# Patient Record
Sex: Male | Born: 1965 | Race: White | Hispanic: No | Marital: Married | Smoking: Never smoker
Health system: Southern US, Community
[De-identification: ages and names within clinical notes are randomized; demographics above are authoritative.]

## PROBLEM LIST (undated history)

## (undated) DIAGNOSIS — E039 Hypothyroidism, unspecified: Secondary | ICD-10-CM

## (undated) DIAGNOSIS — I10 Essential (primary) hypertension: Secondary | ICD-10-CM

## (undated) DIAGNOSIS — A4902 Methicillin resistant Staphylococcus aureus infection, unspecified site: Secondary | ICD-10-CM

## (undated) DIAGNOSIS — E0500 Thyrotoxicosis with diffuse goiter without thyrotoxic crisis or storm: Secondary | ICD-10-CM

## (undated) HISTORY — DX: Methicillin resistant Staphylococcus aureus infection, unspecified site: A49.02

## (undated) HISTORY — DX: Hypothyroidism, unspecified: E03.9

## (undated) HISTORY — DX: Essential (primary) hypertension: I10

## (undated) HISTORY — DX: Thyrotoxicosis with diffuse goiter without thyrotoxic crisis or storm: E05.00

---

## 1998-07-11 ENCOUNTER — Emergency Department (HOSPITAL_COMMUNITY): Admission: EM | Admit: 1998-07-11 | Discharge: 1998-07-11 | Payer: Self-pay | Admitting: Emergency Medicine

## 2000-05-24 ENCOUNTER — Emergency Department (HOSPITAL_COMMUNITY): Admission: EM | Admit: 2000-05-24 | Discharge: 2000-05-24 | Payer: Self-pay

## 2004-10-06 ENCOUNTER — Emergency Department (HOSPITAL_COMMUNITY): Admission: EM | Admit: 2004-10-06 | Discharge: 2004-10-06 | Payer: Self-pay | Admitting: Emergency Medicine

## 2006-01-30 ENCOUNTER — Emergency Department (HOSPITAL_COMMUNITY): Admission: EM | Admit: 2006-01-30 | Discharge: 2006-01-30 | Payer: Self-pay | Admitting: Emergency Medicine

## 2009-04-08 ENCOUNTER — Encounter (HOSPITAL_COMMUNITY): Admission: RE | Admit: 2009-04-08 | Discharge: 2009-05-28 | Payer: Self-pay | Admitting: Family Medicine

## 2009-04-09 ENCOUNTER — Ambulatory Visit (HOSPITAL_COMMUNITY): Admission: RE | Admit: 2009-04-09 | Discharge: 2009-04-09 | Payer: Self-pay | Admitting: Family Medicine

## 2011-04-16 NOTE — Consult Note (Signed)
NAMEFALLOU, HULBERT NO.:  [id number]   MEDICAL RECORD NO.:  [id number]          PATIENT TYPE:  EMS   LOCATION:  ED                           FACILITY:  [facility]   PHYSICIAN:  Vanita Panda. Magnus Ivan, M.D.DATE OF BIRTH:  1966-07-06   DATE OF CONSULTATION:  01/30/2006  DATE OF DISCHARGE:                                   CONSULTATION   CONSULTING PHYSICIAN:  [facility] ER physician and PA.   REASON FOR CONSULTATION:  Cat bite to left hand with cellulitis and  infection.   HISTORY OF PRESENT ILLNESS:  Leroy Lang is a 45 year old right hand dominant  male who his own home domestic cat bit him on the back of the hand early  Saturday morning. After continued swelling and worsening of the redness in  his hand, today he came to the emergency room for further evaluation and  treatment. The hand was red and he had pain in it and so orthopedic hand  surgery was consulted. The patient denied any numbness and tingling in his  hand and reported dorsal hand pain but no palmar based pain.   PAST MEDICAL HISTORY:  None.   ALLERGIES:  No known drug allergies.   MEDICATIONS:  None.   SOCIAL HISTORY:  Denies tobacco and alcohol use. He is a Naval architect.   REVIEW OF SYSTEMS:  Negative for chest pain, shortness of breath, fever,  chills, nausea and vomiting.   PHYSICAL EXAMINATION:  VITAL SIGNS:  Afebrile with stable vital signs.  GENERAL:  This is an alert and oriented male in no acute distress in minimal  discomfort.  EXTREMITIES:  Examination of the left hand showed cellulitis and erythema on  the dorsum of the hand. There is a small puncture wound proximal to the MCP  joint at the long finger. He has full flexion and extension of his hand but  it is certainly somewhat moderately painful to him. The hand is well  perfused and has normal sensation.   IMPRESSION:  Left hand infection with cellulitis and possible abscess status  post cat bite.   PLAN:  After prepping his  hand with Betadine and alcohol, I infiltrated the  small puncture wound with 1% plain lidocaine. I then used a 15 blade and  made an incision about 1 cm in length over the MCP joint. Edema was  encountered and slight purulent material. I then copiously  irrigated this  area with a liter of normal saline solution. I then packed it with a  xeroform to allow for drainage and put a sterile dressing on this. While in  the emergency room,  he received 3 grams of IV Unasyn. He will be discharged with strict  elevation starting soaks tomorrow with twice daily Dial soap soaks of his  hand and he will call the office in the morning for an appointment in the  afternoon. I will also start him on Augmentin 875 mg twice a day with again  close followup.           ______________________________  Vanita Panda. Magnus Ivan, M.D.  CYB/MEDQ  D:  01/30/2006  T:  01/31/2006  Job:  [id number]

## 2012-11-06 ENCOUNTER — Other Ambulatory Visit (HOSPITAL_COMMUNITY): Payer: Self-pay | Admitting: Family Medicine

## 2012-11-06 ENCOUNTER — Ambulatory Visit (HOSPITAL_COMMUNITY)
Admission: RE | Admit: 2012-11-06 | Discharge: 2012-11-06 | Disposition: A | Discharge status: Home / Self Care | Payer: BC Managed Care – PPO | Source: Ambulatory Visit | Attending: Family Medicine | Admitting: Family Medicine

## 2012-11-06 DIAGNOSIS — M25569 Pain in unspecified knee: Secondary | ICD-10-CM | POA: Insufficient documentation

## 2012-11-06 DIAGNOSIS — R52 Pain, unspecified: Secondary | ICD-10-CM

## 2012-11-06 DIAGNOSIS — M545 Low back pain, unspecified: Secondary | ICD-10-CM | POA: Insufficient documentation

## 2013-07-02 IMAGING — CR DG KNEE 1-2V*R*
3 series · 3 of 3 positions shown · non-contrast
Comparison: None.

CLINICAL DATA: Right knee pain.  No trauma.

RIGHT KNEE - 1-2 VIEW

[t knee ap right]
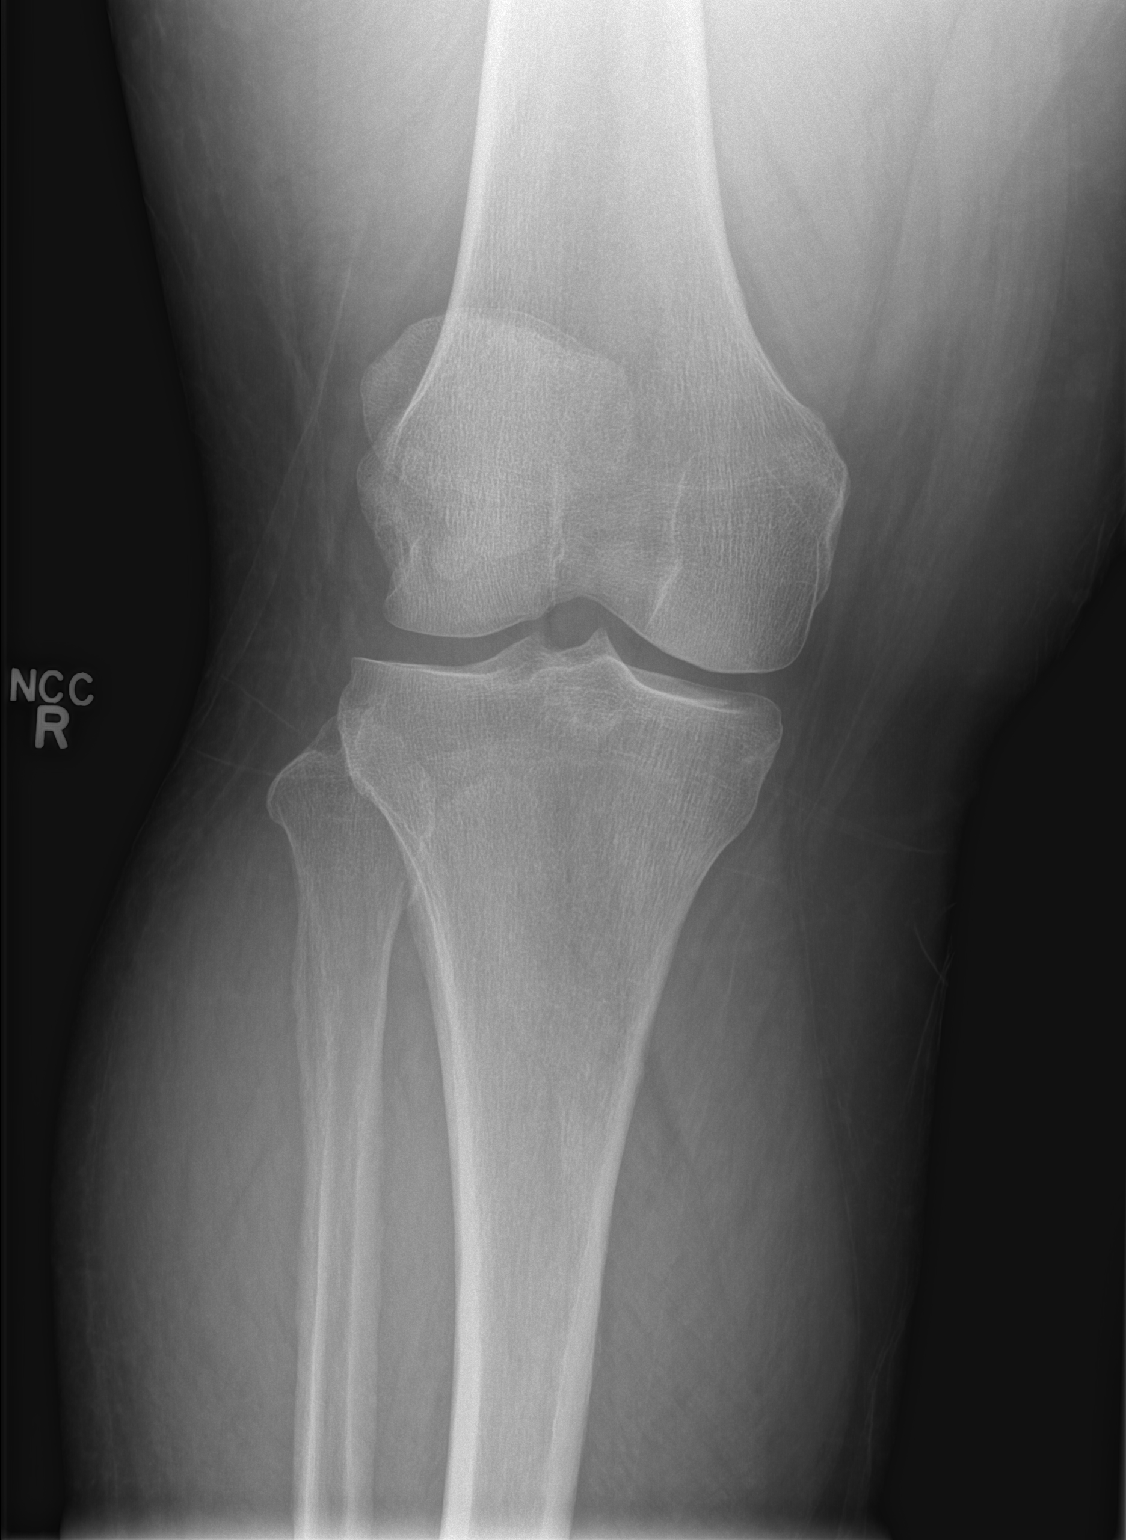

[t knee lat right (1 of 2)]
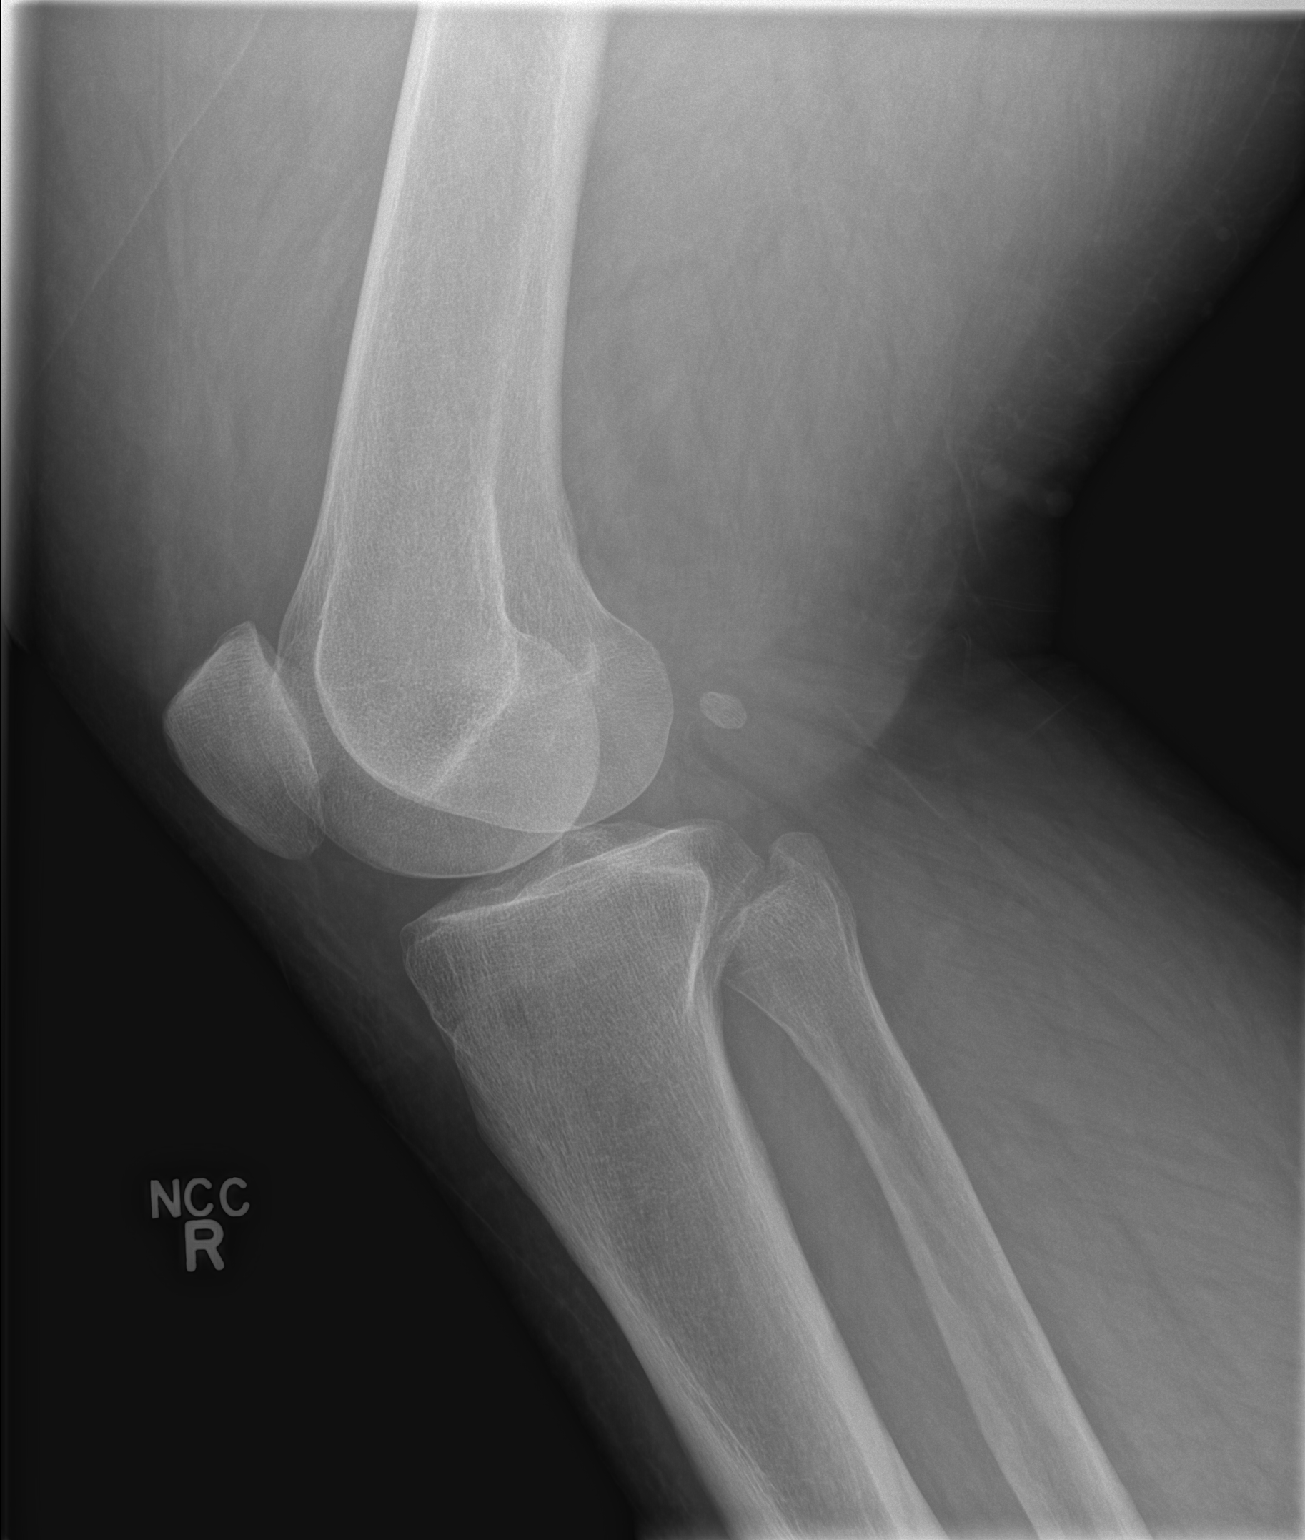

[t knee lat right (2 of 2)]
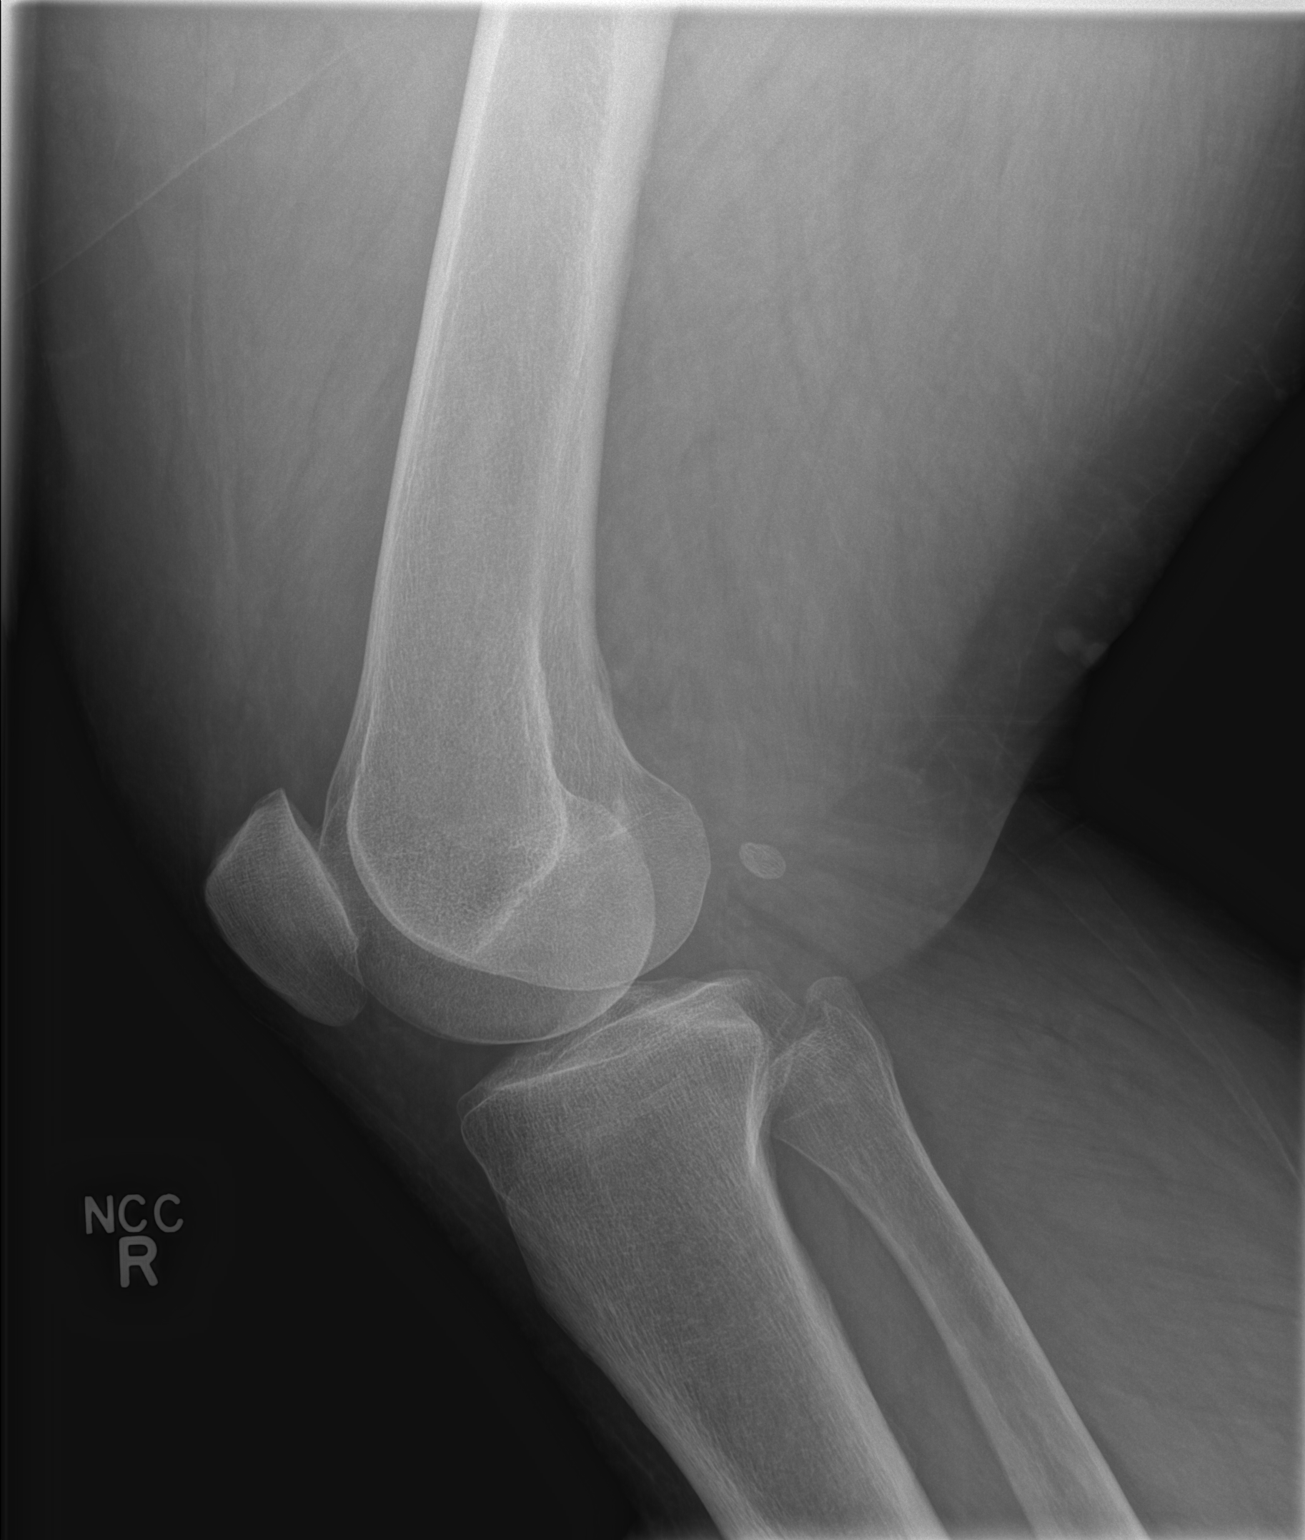

[3 of 3 positions shown; findings below may reference images not displayed]

FINDINGS: No significant joint space narrowing.

Patella subluxed slightly laterally.

No fracture or dislocation.
IMPRESSION: No significant joint space narrowing.

Patella subluxed slightly laterally.

## 2013-07-02 IMAGING — CR DG LUMBAR SPINE 2-3V
3 series · 3 of 3 positions shown · non-contrast
Comparison: None.

CLINICAL DATA: Pain and right knee and lower back.  No trauma.

LUMBAR SPINE - 2-3 VIEW

[t lumbar spine ap]
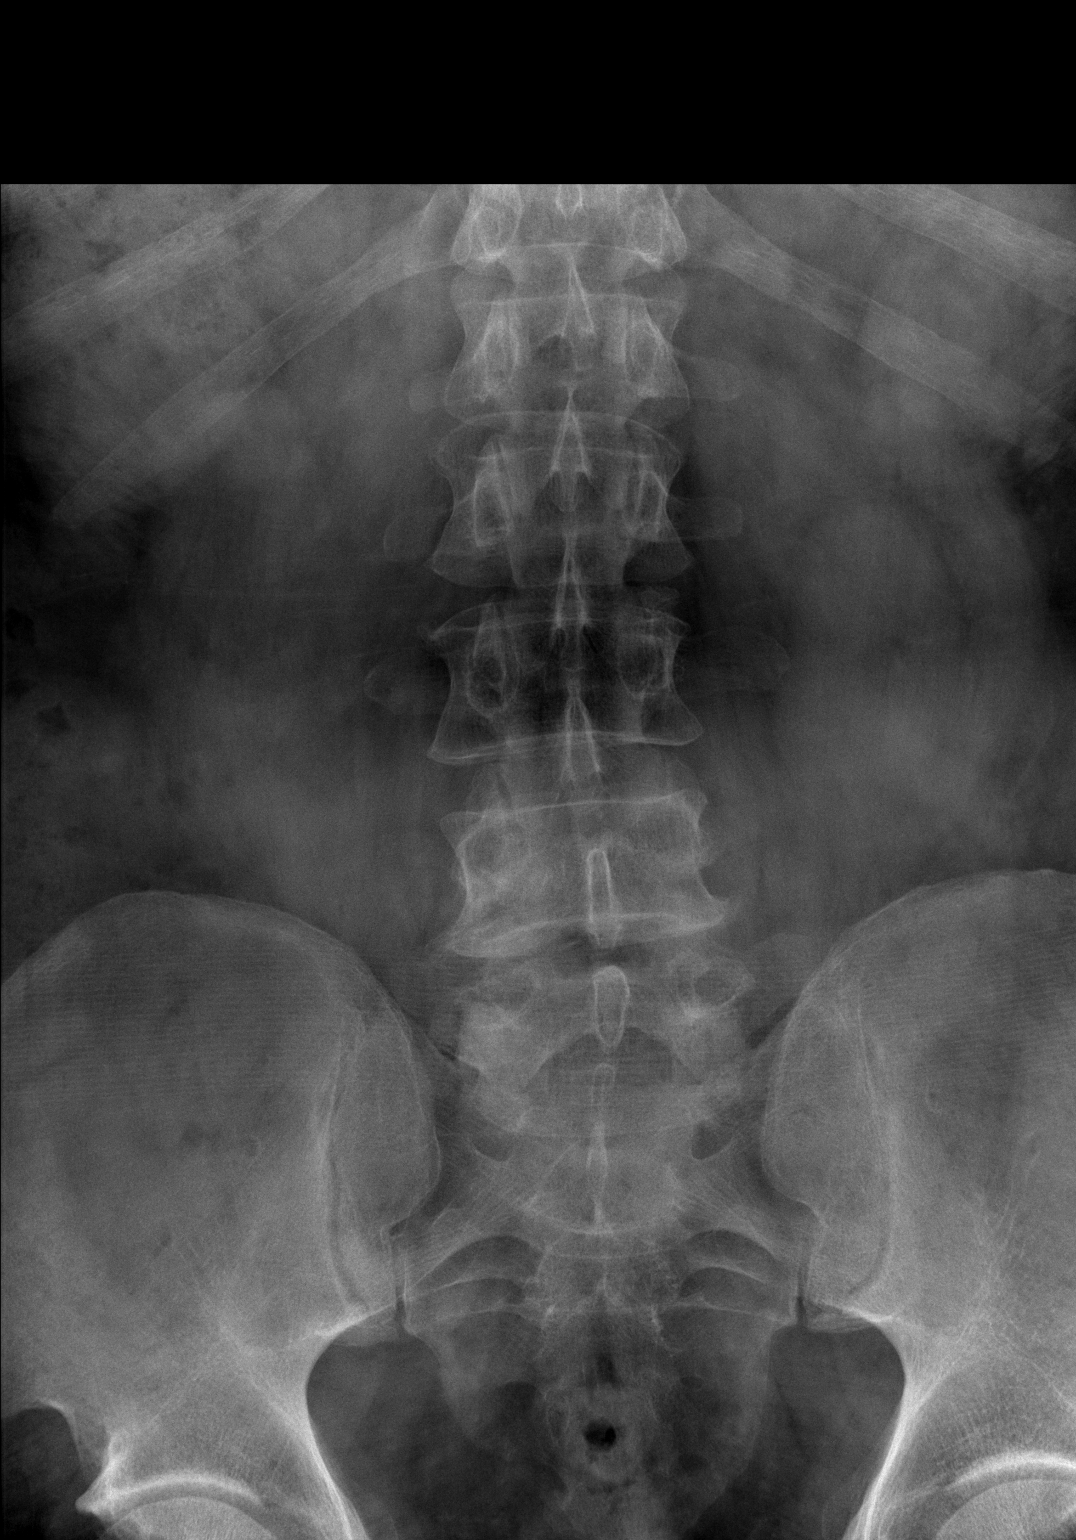

[t lumbar spine lat]
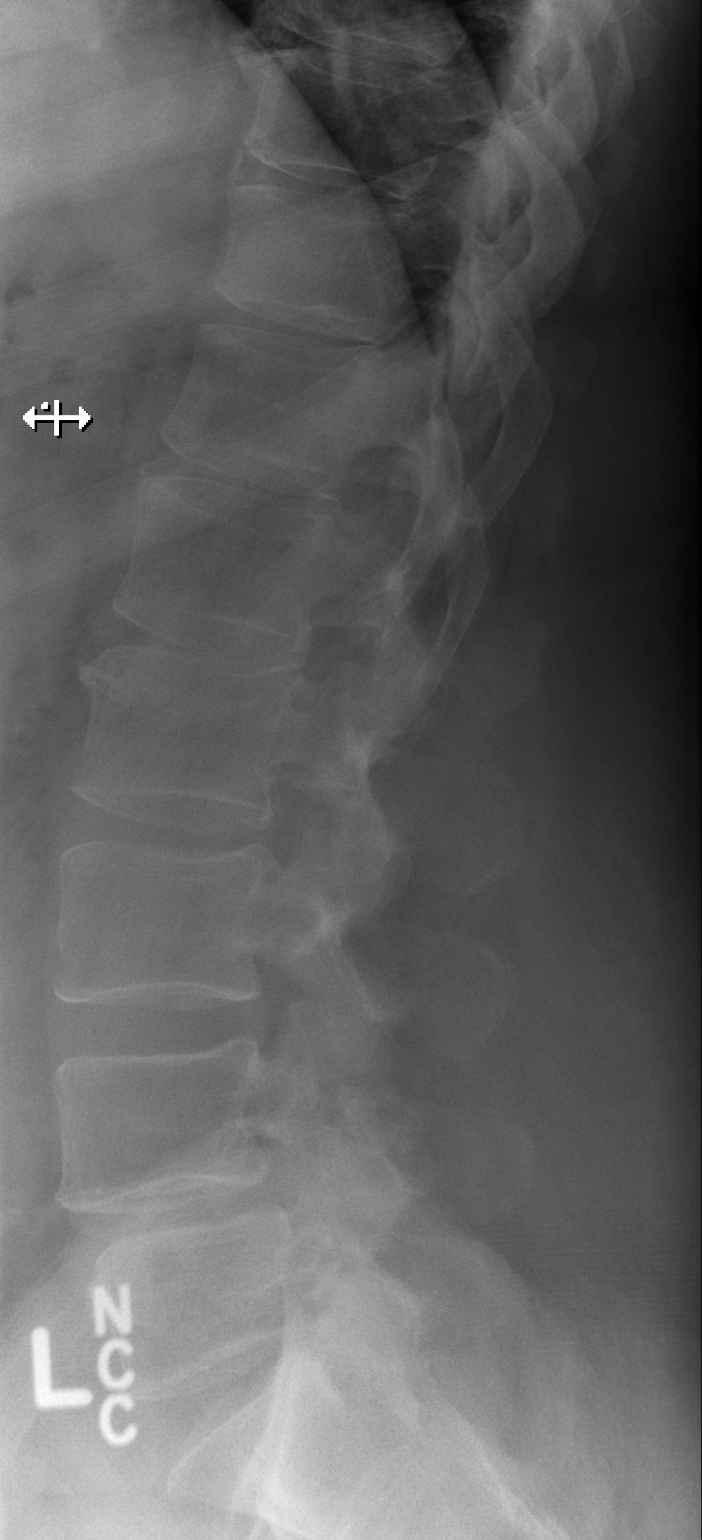

[t lumbar l-5 s-1 spot]
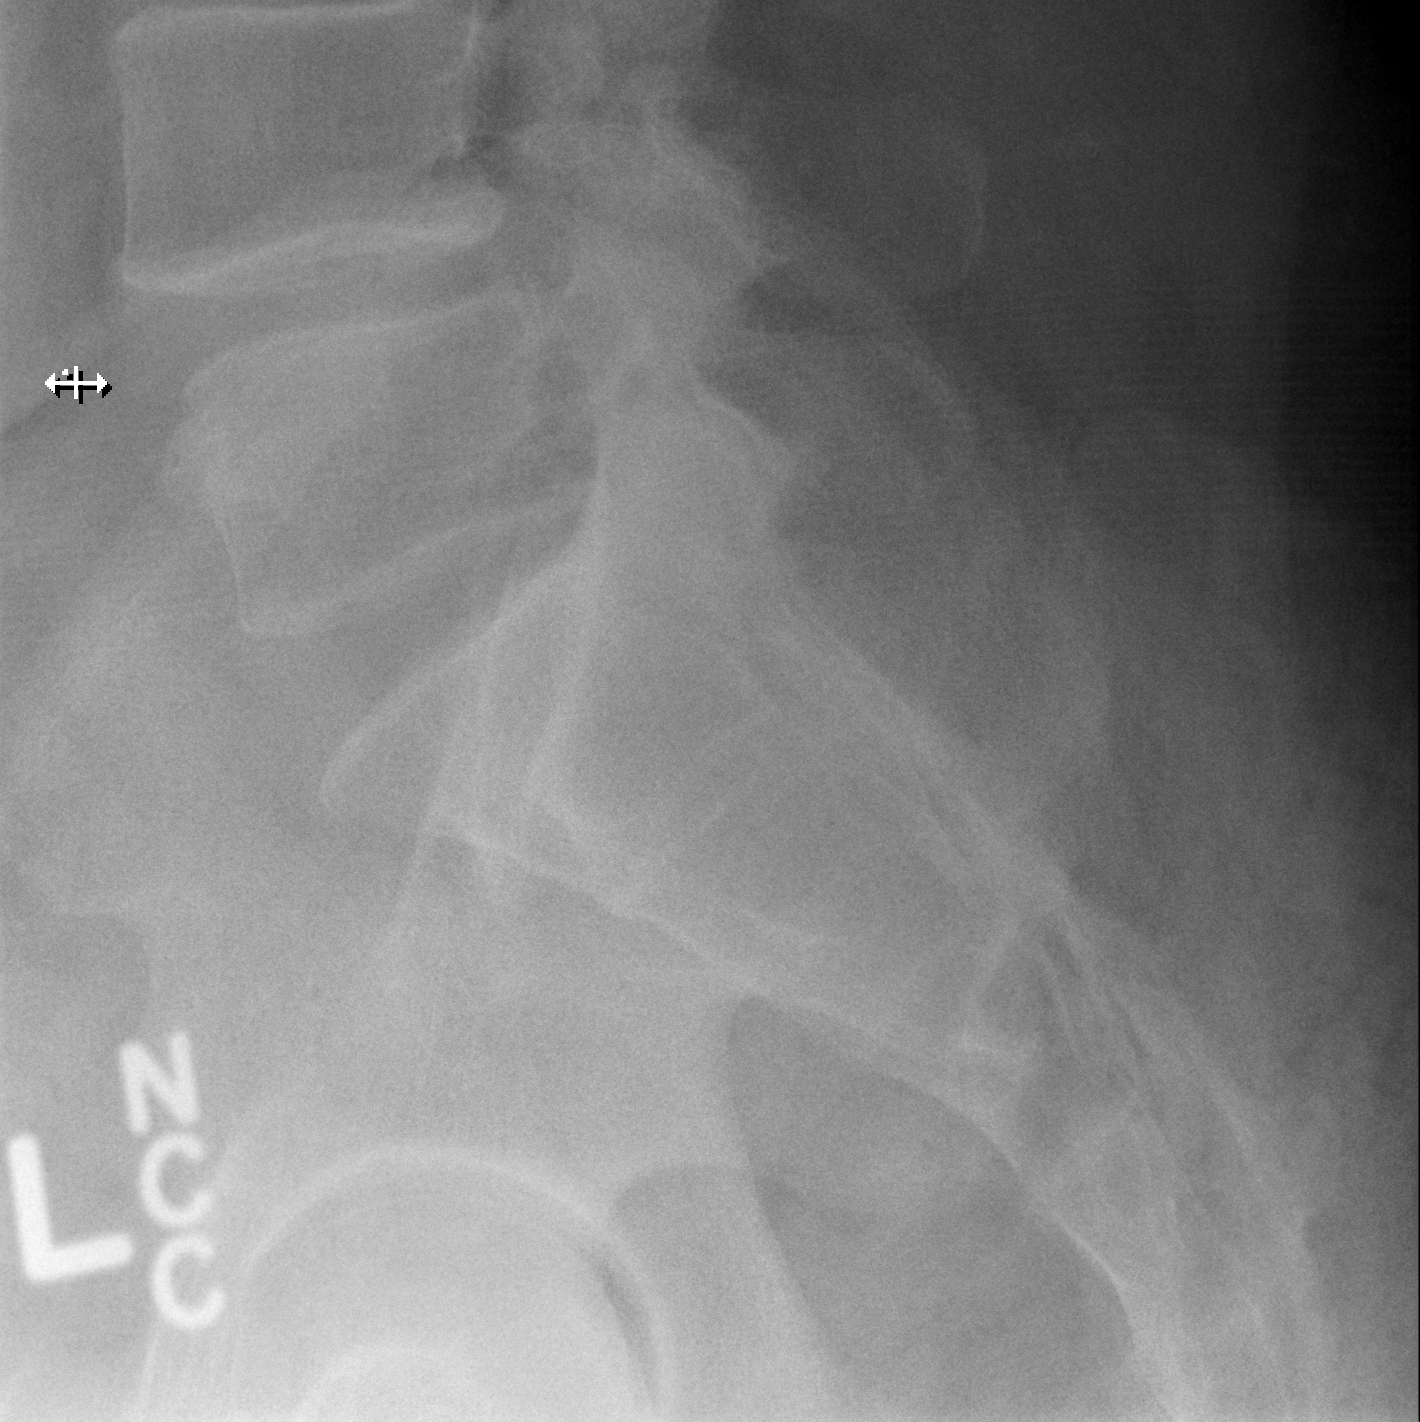

[3 of 3 positions shown; findings below may reference images not displayed]

FINDINGS: There is no compression fracture or worrisome osseous
lesions.  There may be slight disc space narrowing at L4-5 and L2-
3.  Mild right lateral spurring noted L2-L3.  Regional bones of the
pelvis appear intact.
IMPRESSION: No acute abnormality.

## 2015-04-23 ENCOUNTER — Other Ambulatory Visit: Payer: Self-pay | Admitting: Family Medicine

## 2015-04-23 DIAGNOSIS — R5381 Other malaise: Secondary | ICD-10-CM

## 2015-04-25 ENCOUNTER — Ambulatory Visit: Payer: Self-pay

## 2015-06-12 ENCOUNTER — Other Ambulatory Visit: Payer: Self-pay | Admitting: Family Medicine

## 2015-06-12 DIAGNOSIS — R869 Unspecified abnormal finding in specimens from male genital organs: Secondary | ICD-10-CM

## 2015-06-12 DIAGNOSIS — N020 Recurrent and persistent hematuria with minor glomerular abnormality: Secondary | ICD-10-CM

## 2015-06-16 ENCOUNTER — Inpatient Hospital Stay: Admission: RE | Admit: 2015-06-16 | Payer: Self-pay | Source: Ambulatory Visit

## 2015-06-23 ENCOUNTER — Ambulatory Visit: Payer: Self-pay | Admitting: Skilled Nursing Facility1

## 2015-08-25 ENCOUNTER — Ambulatory Visit: Payer: Self-pay | Admitting: Dietician

## 2015-12-20 ENCOUNTER — Ambulatory Visit (INDEPENDENT_AMBULATORY_CARE_PROVIDER_SITE_OTHER): Payer: BLUE CROSS/BLUE SHIELD | Admitting: Family Medicine

## 2015-12-20 VITALS — BP 130/80 | HR 65 | Temp 97.8°F | Resp 18 | Ht 69.0 in | Wt 388.5 lb

## 2015-12-20 DIAGNOSIS — I10 Essential (primary) hypertension: Secondary | ICD-10-CM | POA: Diagnosis not present

## 2015-12-20 DIAGNOSIS — E669 Obesity, unspecified: Secondary | ICD-10-CM | POA: Diagnosis not present

## 2015-12-20 DIAGNOSIS — J029 Acute pharyngitis, unspecified: Secondary | ICD-10-CM | POA: Diagnosis not present

## 2015-12-20 DIAGNOSIS — E039 Hypothyroidism, unspecified: Secondary | ICD-10-CM | POA: Diagnosis not present

## 2015-12-20 LAB — POCT RAPID STREP A (OFFICE): RAPID STREP A SCREEN: NEGATIVE

## 2015-12-20 NOTE — Patient Instructions (Signed)
Your strep is negative- you likely have a viral or irritant sore throat.  Call me if you are not feeling better in the next few days- Sooner if worse.

## 2015-12-20 NOTE — Progress Notes (Signed)
[facility] [address], [place] [address] [phone]  Date:  12/20/2015   Name:  Leroy Lang   DOB:  1966-05-10   MRN:  [id number]  PCP:  Aida Puffer, MD    Chief Complaint: Sore Throat   History of Present Illness:  Leroy Lang is a 50 y.o. very pleasant male patient who presents with the following:  Generally well except for obesity.  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   He just wants to be sure he does not have strep throat as he has this about 10 years ago and got pretty sick with it.   He has noticed a ST for the last 2-3 days.  It is getting a bit worse.   He has not noted a fever.  He otherwise feels pretty good.    No earache. He does have a very mild cough which seems due to clearing his throat only.    There are no active problems to display for this patient.   No past medical history on file.  No past surgical history on file.  Social History  Substance Use Topics  . Smoking status: Former Games developer  . Smokeless tobacco: None  . Alcohol Use: No    No family history on file.  No Known Allergies  Medication list has been reviewed and updated.  No current outpatient prescriptions on file prior to visit.   No current facility-administered medications on file prior to visit.    Review of Systems:  As per HPI- otherwise negative.   Physical Examination: Filed Vitals:   12/20/15 1540  BP: 130/80  Pulse: 65  Temp: 97.8 F (36.6 C)  Resp: 18   Filed Vitals:   12/20/15 1540  Height:  (1.753 m)  Weight: 388 lb 8 oz (176.222 kg)   Body mass index is 57.35 kg/(m^2). Ideal Body Weight: Weight in (lb) to have BMI = 25: 168.9  GEN: WDWN, NAD, Non-toxic, A & O x 3, obese, looks well HEENT: Atraumatic, Normocephalic. Neck supple. No masses, No LAD.  Bilateral TM wnl, oropharynx normal.  PEERL,EOMI.   Ears and Nose: No external deformity. CV: RRR, No M/G/R. No JVD. No thrill. No extra heart  sounds. PULM: CTA B, no wheezes, crackles, rhonchi. No retractions. No resp. distress. No accessory muscle use.Marland Kitchen EXTR: No c/c/e NEURO Normal gait.  PSYCH: Normally interactive. Conversant. Not depressed or anxious appearing.  Calm demeanor.   Results for orders placed or performed in visit on 12/20/15  POCT rapid strep A  Result Value Ref Range   Rapid Strep A Screen Negative Negative    Assessment and Plan: Acute pharyngitis, unspecified etiology - Plan: POCT rapid strep A  Essential hypertension  Obesity  Hypothyroidism, unspecified hypothyroidism type  Strep negative- reassured.  He will let me know if not better soon  Signed Abbe Amsterdam, MD

## 2016-12-06 ENCOUNTER — Ambulatory Visit: Payer: Self-pay | Admitting: Registered"

## 2018-01-23 ENCOUNTER — Encounter: Payer: Self-pay | Admitting: Neurology

## 2018-01-23 ENCOUNTER — Ambulatory Visit: Payer: 59 | Admitting: Neurology

## 2018-01-23 VITALS — BP 141/87 | HR 59 | Ht 68.0 in | Wt 345.0 lb

## 2018-01-23 DIAGNOSIS — Z6841 Body Mass Index (BMI) 40.0 and over, adult: Secondary | ICD-10-CM

## 2018-01-23 DIAGNOSIS — Z820 Family history of epilepsy and other diseases of the nervous system: Secondary | ICD-10-CM | POA: Diagnosis not present

## 2018-01-23 DIAGNOSIS — R0683 Snoring: Secondary | ICD-10-CM | POA: Diagnosis not present

## 2018-01-23 DIAGNOSIS — R351 Nocturia: Secondary | ICD-10-CM | POA: Diagnosis not present

## 2018-01-23 NOTE — Progress Notes (Signed)
Subjective:    Patient ID: Leroy Lang is a 52 y.o. male.  HPI     Huston Foley, MD, PhD [facility] [address], [address] [address] [place], [place] [address]  Dear Dr. Clarene Duke,   I saw your patient, Leroy Lang, upon your kind request in my neurologic clinic today for initial consultation of his sleep disorder, in particular, concern for underlying obstructive sleep apnea. The patient is unaccompanied today. As you know, Mr. Leroy Lang is a 52 year old right-handed gentleman with an underlying medical history of hypothyroidism, prediabetes, hypertension, and morbid obesity with a BMI of over 50, who reports that he needs a sleep study because of his DOT physical. He does endorse snoring and denies any excessive daytime somnolence. I reviewed your office note from 12/12/2017, which you kindly included. 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 His Epworth sleepiness score is 7 out of 24, fatigue score is 18 out of 63. He quit smoking, drinks alcohol rarely, drinks caffeine in the form of coffee, 2 20 ounce servings per day on average. He works at night, work hours are 7:45 PM to 6:10 AM. He typically goes to bed around 8 AM and rise time is around 4:30 PM. He tries to maintain a similar schedule on the weekend. He lives at home with his wife and 47 year old daughter as well as 73 year old daughter. His father has sleep apnea and uses a CPAP machine. Patient denies any restless leg symptoms. He has nocturia about once per average night, denies morning headaches. He is trying to lose weight.  His Past Medical History Is Significant For: Past Medical History:  Diagnosis Date  . Graves disease   . Hypertension   . Hypothyroidism   . MRSA (methicillin resistant Staphylococcus aureus)     His Past Surgical History Is Significant For:   His Family History Is Significant For: No family history on file.  His Social History Is Significant For: Social History   Socioeconomic  History  . Marital status: Married    Spouse name: None  . Number of children: None  . Years of education: None  . Highest education level: None  Social Needs  . Financial resource strain: None  . Food insecurity - worry: None  . Food insecurity - inability: None  . Transportation needs - medical: None  . Transportation needs - non-medical: None  Occupational History  . None  Tobacco Use  . Smoking status: Former Games developer  . Smokeless tobacco: Never Used  Substance and Sexual Activity  . Alcohol use: No    Alcohol/week: 0.0 oz  . Drug use: No  . Sexual activity: None  Other Topics Concern  . None  Social History Narrative  . None    His Allergies Are:  Allergies  Allergen Reactions  . Ampicillin   :   His Current Medications Are:  Outpatient Encounter Medications as of 01/23/2018  Medication Sig  . bisoprolol-hydrochlorothiazide (ZIAC) 5-6.25 MG tablet Take 1 tablet by mouth daily.  Marland Kitchen levothyroxine (SYNTHROID, LEVOTHROID) 175 MCG tablet Take 175 mcg by mouth daily before breakfast.   No facility-administered encounter medications on file as of 01/23/2018.   :  Review of Systems:  Out of a complete 14 point review of systems, all are reviewed and negative with the exception of these symptoms as listed below:  Review of Systems  Neurological:       Pt presents today to discuss his sleep. Pt does not think he snores.  Pt has never had a sleep study.  Epworth Sleepiness Scale 0= would never doze 1= slight chance of dozing 2= moderate chance of dozing 3= high chance of dozing  Sitting and reading: 1 Watching TV: 1 Sitting inactive in a public place (ex. Theater or meeting): 1 As a passenger in a car for an hour without a break: 1 Lying down to rest in the afternoon: 3 Sitting and talking to someone: 0 Sitting quietly after lunch (no alcohol): 0 In a car, while stopped in traffic: 0 Total: 7     Objective:  Neurological Exam  Physical Exam Physical  Examination:   Vitals:   01/23/18 1543  BP: (!) 141/87  Pulse: (!) 59   General Examination: The patient is a very pleasant 52 y.o. male in no acute distress. He appears well-developed and well-nourished and well groomed.   HEENT: Normocephalic, atraumatic, pupils are equal, round and reactive to light and accommodation. Extraocular tracking is good without limitation to gaze excursion or nystagmus noted. Normal smooth pursuit is noted. Hearing is grossly intact. Face is symmetric with normal facial animation and normal facial sensation. Speech is clear with no dysarthria noted. There is no hypophonia. There is no lip, neck/head, jaw or voice tremor. Neck is supple with full range of passive and active motion. There are no carotid bruits on auscultation. Oropharynx exam reveals: mild mouth dryness, adequate dental hygiene and moderate airway crowding, due to redundant soft palate, large uvula, tonsils are 1+ are small, not completely visualized, Mallampati is class III. Neck circumference is 19-1/4 inches.  Chest: Clear to auscultation without wheezing, rhonchi or crackles noted.  Heart: S1+S2+0, regular and normal without murmurs, rubs or gallops noted.   Abdomen: Soft, non-tender and non-distended with normal bowel sounds appreciated on auscultation.  Extremities: There is no pitting edema in the distal lower extremities bilaterally.   Skin: Warm and dry without trophic changes noted.  Musculoskeletal: exam reveals no obvious joint deformities, tenderness or joint swelling or erythema.   Neurologically:  Mental status: The patient is awake, alert and oriented in all 4 spheres. His immediate and remote memory, attention, language skills and fund of knowledge are appropriate. There is no evidence of aphasia, agnosia, apraxia or anomia. Speech is clear with normal prosody and enunciation. Thought process is linear. Mood is normal and affect is normal.  Cranial nerves II - XII are as described  above under HEENT exam. In addition: shoulder shrug is normal with equal shoulder height noted. Motor exam: Normal bulk, strength and tone is noted. There is no drift, tremor or rebound. Romberg is negative. Reflexes are 2+ throughout. Fine motor skills and coordination: grossly intact.  Cerebellar testing: No dysmetria or intention tremor. There is no truncal or gait ataxia.  Sensory exam: intact to light touch.  Gait, station and balance: He stands easily. No veering to one side is noted. No leaning to one side is noted. Posture is age-appropriate and stance is narrow based. Gait shows normal stride length and normal pace. No problems turning are noted. Tandem walk is unremarkable.   Assessment and Plan:    In summary, KASTEN LEVEQUE is a very pleasant 52 y.o.-year old male with an underlying medical history of hypothyroidism, prediabetes, hypertension, and morbid obesity with a BMI of over 50, whose history, FHx,  and physical exam are concerning for obstructive sleep apnea (OSA). I had a long chat with the patient about my findings and the diagnosis of OSA, its prognosis and treatment  options. We talked about medical treatments, surgical interventions and non-pharmacological approaches. I explained in particular the risks and ramifications of untreated moderate to severe OSA, especially with respect to developing cardiovascular disease down the Road, including congestive heart failure, difficult to treat hypertension, cardiac arrhythmias, or stroke. Even type 2 diabetes has, in part, been linked to untreated OSA. Symptoms of untreated OSA include daytime sleepiness, memory problems, mood irritability and mood disorder such as depression and anxiety, lack of energy, as well as recurrent headaches, especially morning headaches. We talked about trying to maintain a healthy lifestyle in general, as well as the importance of weight control. I encouraged the patient to eat healthy, exercise daily and keep  well hydrated, to keep a scheduled bedtime and wake time routine, to not skip any meals and eat healthy snacks in between meals. I advised the patient not to drive when feeling sleepy. I recommended the following at this time: sleep study with potential positive airway pressure titration. (We will score hypopneas at 4%).   I explained the sleep test procedure to the patient and also outlined possible surgical and non-surgical treatment options of OSA, including the use of a custom-made dental device (which would require a referral to a specialist dentist or oral surgeon), upper airway surgical options, such as pillar implants, radiofrequency surgery, tongue base surgery, and UPPP (which would involve a referral to an ENT surgeon). Rarely, jaw surgery such as mandibular advancement may be considered.  I also explained the CPAP treatment option to the patient, who indicated that he would be willing to try CPAP if the need arises. I explained the importance of being compliant with PAP treatment, not only for insurance purposes but primarily to improve His symptoms, and for the patient's long term health benefit, including to reduce His cardiovascular risks. I answered all his questions today and the patient was in agreement. I would like to see him back after the sleep study is completed and encouraged him to call with any interim questions, concerns, problems or updates.   Thank you very much for allowing me to participate in the care of this nice patient. If I can be of any further assistance to you please do not hesitate to call me at [phone].  Sincerely,   Huston FoleySaima Daelan Gatt, MD, PhD

## 2018-01-23 NOTE — Patient Instructions (Signed)

## 2018-02-08 ENCOUNTER — Ambulatory Visit (INDEPENDENT_AMBULATORY_CARE_PROVIDER_SITE_OTHER): Payer: 59 | Admitting: Neurology

## 2018-02-08 DIAGNOSIS — G4710 Hypersomnia, unspecified: Secondary | ICD-10-CM | POA: Diagnosis not present

## 2018-02-08 DIAGNOSIS — Z6841 Body Mass Index (BMI) 40.0 and over, adult: Secondary | ICD-10-CM

## 2018-02-08 DIAGNOSIS — R0683 Snoring: Secondary | ICD-10-CM

## 2018-02-08 DIAGNOSIS — R351 Nocturia: Secondary | ICD-10-CM

## 2018-02-08 DIAGNOSIS — Z820 Family history of epilepsy and other diseases of the nervous system: Secondary | ICD-10-CM

## 2018-02-09 ENCOUNTER — Telehealth: Payer: Self-pay | Admitting: Neurology

## 2018-02-09 NOTE — Telephone Encounter (Signed)
Pt called he has DOT appt on Monday and needs to take cc of the sleep study with him. He was told (he doesn't remember who in the sleep lab) a message would be left to please expeditie the reading of the results. Please call to advise. He is aware Dr Frances FurbishAthar reads sleep studies on Friday's.

## 2018-02-10 ENCOUNTER — Telehealth: Payer: Self-pay

## 2018-02-10 NOTE — Telephone Encounter (Signed)
Pt has called back stating that he would very much like to get the results as soon as possible as a result of the DOT appointment that he has on Monday, please call

## 2018-02-10 NOTE — Progress Notes (Signed)
Patient referred by Dr. Clarene DukeLittle, seen by me on 01/23/18, diagnostic PSG on 02/08/18.   Please call and notify the patient that the recent sleep study did not show any significant obstructive sleep apnea. May show some underestimation of sleep apnea, d/t lack of supine sleep. He can FU with Dr. Clarene DukeLittle. FU with me as needed.   Thanks,  Huston FoleySaima Hektor Huston, MD, PhD [facility]

## 2018-02-10 NOTE — Telephone Encounter (Signed)
I called pt. I advised him that his recent sleep study did not show any signficiant osa but this number may be underestimated because he did not sleep on his back. Pt can follow up with Dr. Clarene DukeLittle and with Dr. Frances FurbishAthar on an as needed basis. Pt is asking for a copy of his sleep study to be placed for pick up at the front desk for pick up on Monday. Pt verbalized understanding of results. Pt had no questions at this time but was encouraged to call back if questions arise.

## 2018-02-10 NOTE — Procedures (Signed)
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 He does endorse snoring, but denies any excessive daytime somnolence. His Epworth sleepiness score is 7 out of 24, BMI of 52.1 kg/m2. The patient's neck circumference measured 19.5 inches.  CURRENT MEDICATIONS: Ziac, Synthroid.   PROCEDURE:  This is a multichannel digital polysomnogram utilizing the Somnostar 11.2 system.  Electrodes and sensors were applied and monitored per AASM Specifications.   EEG, EOG, Chin and Limb EMG, were sampled at 200 Hz.  ECG, Snore and Nasal Pressure, Thermal Airflow, Respiratory Effort, CPAP Flow and Pressure, Oximetry was sampled at 50 Hz. Digital video and audio were recorded.      BASELINE STUDY  Lights Out was at 21:09 and Lights On at 05:17.  Total recording time (TRT) was 488.5 minutes, with a total sleep time (TST) of  432.5 minutes.   The patient's sleep latency was 24.5 minutes.  REM latency was 245.5 minutes which is markedly delayed. The sleep efficiency was 88.5 %.     SLEEP ARCHITECTURE: WASO (Wake after sleep onset) was 31.5 minutes with minimal sleep fragmentation noted and one longer period of wakefulness. There were 10.5 minutes in Stage N1, 229 minutes Stage N2, 109 minutes Stage N3 and 84 minutes in Stage REM.  The percentage of Stage N1 was 2.4%, Stage N2 was 52.9%, which is normal, Stage N3 was 25.2%, which is mildly increased, and Stage R (REM sleep) was 19.4%, which is normal. The arousals were noted as: 9 were spontaneous, 0 were associated with PLMs, 0 were associated with respiratory events.  Audio and video analysis did not show any abnormal or unusual movements,  behaviors, phonations or vocalizations. The patient took 1 bathroom break. Mild snoring was noted. Of note, the patient, insisted on sleeping on his stomach. The EKG was in keeping with normal sinus rhythm (NSR).  RESPIRATORY ANALYSIS:  There were a total of 9 respiratory events:  0 obstructive apneas, 0 central apneas and 0 mixed apneas with a total of 0 apneas and an apnea index (AI) of 0 /hour. There were 9 hypopneas with a hypopnea index of 1.2 /hour. The patient also had 0 respiratory event related arousals (RERAs).      The total APNEA/HYPOPNEA INDEX (AHI) was 1.2/hour and the total RESPIRATORY DISTURBANCE INDEX was 1.2 /hour.  8 events occurred in REM sleep and 2 events in NREM. The REM AHI was 5.7 /hour, versus a non-REM AHI of .2. The patient spent 0 minutes of total sleep time in the supine position and 433 minutes in non-supine.. The supine AHI was 0.0 versus a non-supine AHI of 1.2.  OXYGEN SATURATION & C02:  The Wake baseline 02 saturation was 94%, with the lowest being 89%. Time spent below 89% saturation equaled 0 minutes.  PERIODIC LIMB MOVEMENTS: The patient had a total of 5 Periodic Limb Movements.  The Periodic Limb Movement (PLM) index was .7 and the PLM Arousal index was 0/hour.  Post-study, the patient indicated that sleep was the same as usual.   IMPRESSION:  1. Primary Snoring  RECOMMENDATIONS:  1. This study does not demonstrate any significant  obstructive or central sleep disordered breathing. The absence of supine sleep may underestimate his sleep disordered breathing. This study does not support an intrinsic sleep disorder as a cause of the patient's symptoms. Other causes, including circadian rhythm disturbances, an underlying mood disorder, medication effect and/or an underlying medical problem cannot be ruled out. 2. The patient should be cautioned not to drive, work at heights, or operate dangerous or heavy equipment when tired or sleepy. Review and reiteration of  good sleep hygiene measures should be pursued with any patient. 3. The patient can follow-up with his referring provider, who will be notified of the test results.  I certify that I have reviewed the entire raw data recording prior to the issuance of this report in accordance with the Standards of Accreditation of the American Academy of Sleep Medicine (AASM)   Huston FoleySaima Raiyan Dalesandro, MD, PhD Diplomat, American Board of Psychiatry and Neurology (Neurology and Sleep)

## 2018-02-10 NOTE — Telephone Encounter (Signed)
-----   Message from Huston FoleySaima Athar, MD sent at 02/10/2018 12:12 PM EDT ----- Patient referred by Dr. Clarene DukeLittle, seen by me on 01/23/18, diagnostic PSG on 02/08/18.   Please call and notify the patient that the recent sleep study did not show any significant obstructive sleep apnea. May show some underestimation of sleep apnea, d/t lack of supine sleep. He can FU with Dr. Clarene DukeLittle. FU with me as needed.   Thanks,  Huston FoleySaima Athar, MD, PhD [facility]

## 2018-02-10 NOTE — Telephone Encounter (Signed)
See telephone note from 02/10/18.

## 2018-11-12 DIAGNOSIS — R030 Elevated blood-pressure reading, without diagnosis of hypertension: Secondary | ICD-10-CM | POA: Diagnosis not present

## 2018-11-12 DIAGNOSIS — J309 Allergic rhinitis, unspecified: Secondary | ICD-10-CM | POA: Diagnosis not present

## 2021-05-14 ENCOUNTER — Inpatient Hospital Stay: Payer: Self-pay

## 2021-05-14 ENCOUNTER — Inpatient Hospital Stay: Payer: Self-pay | Admitting: Hematology & Oncology

## 2021-06-25 ENCOUNTER — Inpatient Hospital Stay: Payer: Self-pay | Admitting: Hematology & Oncology

## 2021-06-25 ENCOUNTER — Inpatient Hospital Stay: Payer: Self-pay

## 2023-12-31 ENCOUNTER — Telehealth: Payer: Self-pay | Admitting: Physician Assistant

## 2023-12-31 ENCOUNTER — Ambulatory Visit (HOSPITAL_BASED_OUTPATIENT_CLINIC_OR_DEPARTMENT_OTHER)
Admission: RE | Admit: 2023-12-31 | Discharge: 2023-12-31 | Disposition: A | Discharge status: Home / Self Care | Payer: 59 | Source: Ambulatory Visit | Attending: Physician Assistant | Admitting: Physician Assistant

## 2023-12-31 ENCOUNTER — Ambulatory Visit
Admission: EM | Admit: 2023-12-31 | Discharge: 2023-12-31 | Disposition: A | Discharge status: Home / Self Care | Payer: 59

## 2023-12-31 DIAGNOSIS — S20212A Contusion of left front wall of thorax, initial encounter: Secondary | ICD-10-CM | POA: Diagnosis not present

## 2023-12-31 DIAGNOSIS — S2232XA Fracture of one rib, left side, initial encounter for closed fracture: Secondary | ICD-10-CM

## 2023-12-31 DIAGNOSIS — R0781 Pleurodynia: Secondary | ICD-10-CM | POA: Diagnosis present

## 2023-12-31 MED ORDER — HYDROCODONE-ACETAMINOPHEN 5-325 MG PO TABS: 0.5000 | ORAL_TABLET | Freq: Two times a day (BID) | ORAL | 0 refills | Status: AC | PRN

## 2023-12-31 MED ORDER — IBUPROFEN 600 MG PO TABS: 600.0000 mg | ORAL_TABLET | Freq: Four times a day (QID) | ORAL | 0 refills | Status: AC | PRN

## 2023-12-31 MED ORDER — LIDOCAINE 5 % EX PTCH: 1.0000 | MEDICATED_PATCH | CUTANEOUS | 0 refills | Status: AC

## 2023-12-31 NOTE — Discharge Instructions (Signed)
I will contact you if your x-ray is abnormal.  Please apply lidocaine patch during the day and then remove this at night.  Use only 1 patch per 24 hours.  Start ibuprofen 600 mg for pain.  Do not take NSAIDs with this medication including aspirin, ibuprofen/Advil, naproxen/Aleve.  Obtain an incentive spirometer from the pharmacy and make sure that you are doing deep breathing exercises to prevent atelectasis.  Follow-up with your primary care next week.  If anything worsens or changes and you have chest pain, shortness of breath, fever, increasing pain you should go to the ER.
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
## 2023-12-31 NOTE — Telephone Encounter (Signed)
Patient had positive rib fracture.  Called and discussed results.  Short course of hydrocodone sent for pain management.  Discussed that this is addictive and sedating he should limit use is much as possible.  Review of [place] controlled substance database was noted appropriate refills.  All questions answered to patient satisfaction.  See result note for additional information.
